# Patient Record
Sex: Female | Born: 2000 | Race: Black or African American | Hispanic: No | Marital: Single | Smoking: Never smoker
Health system: Southern US, Community
[De-identification: ages and names within clinical notes are randomized; demographics above are authoritative.]

---
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
## 2020-03-03 IMAGING — US US OB LIMITED
1 series · 14 of 28 positions shown · non-contrast
Comparison: none

CLINICAL DATA: Abdominal pain and vomiting for 1 day.  Unknown LMP.

EXAM:
LIMITED OBSTETRIC ULTRASOUND

[Series 1: us ob limited · 0.22mm/px · 14 of 45 slices shown]
[im 2/45]
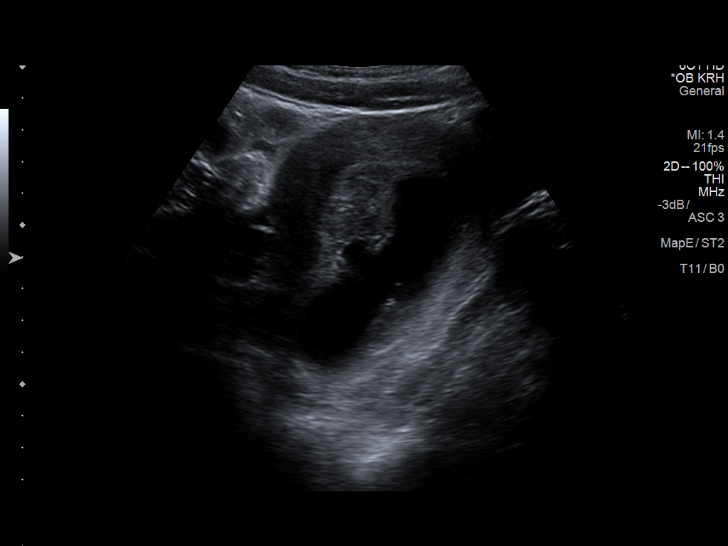
[im 5/45]
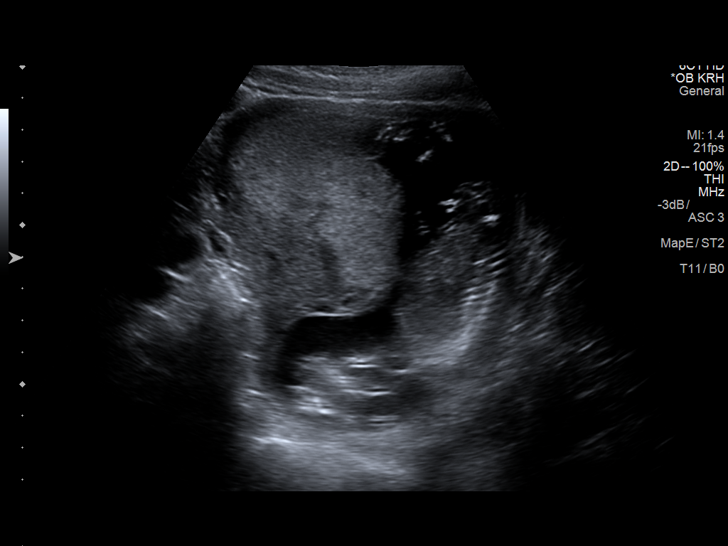
[im 9/45]
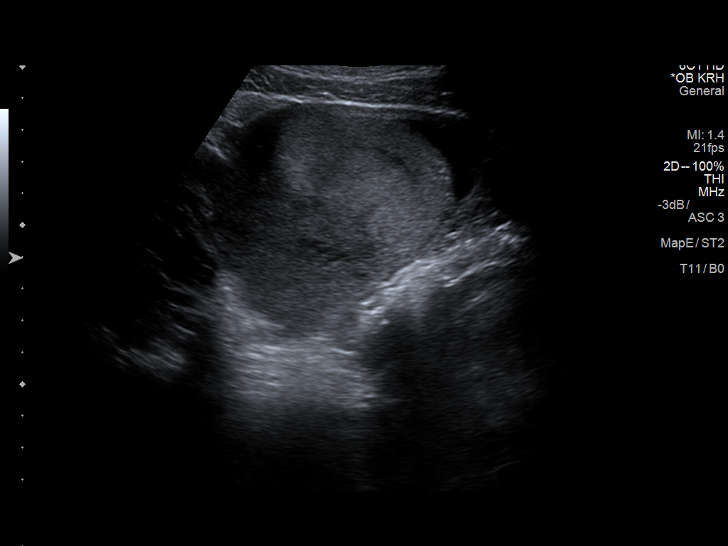
[im 12/45]
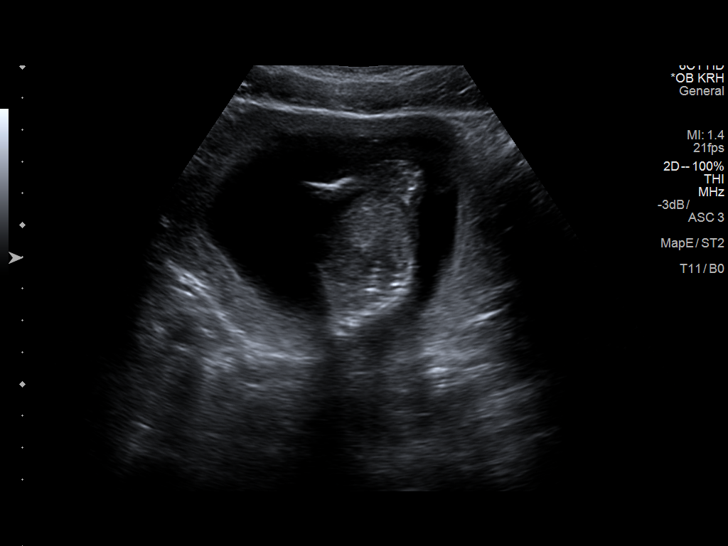
[im 15/45]
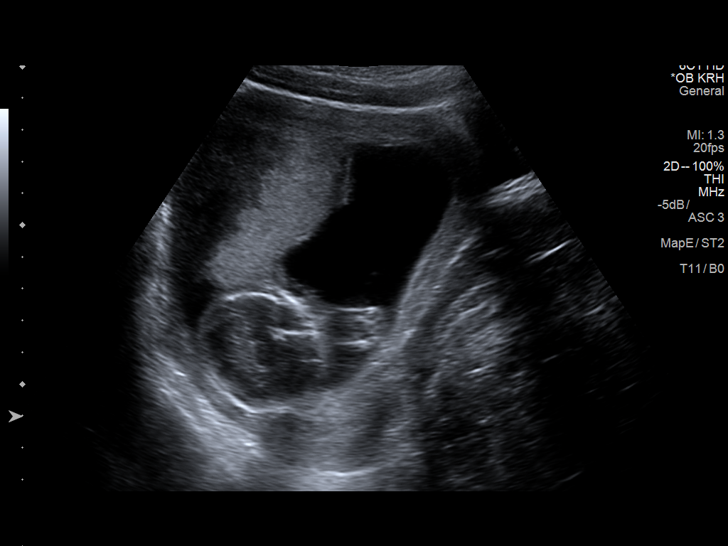
[im 18/45]
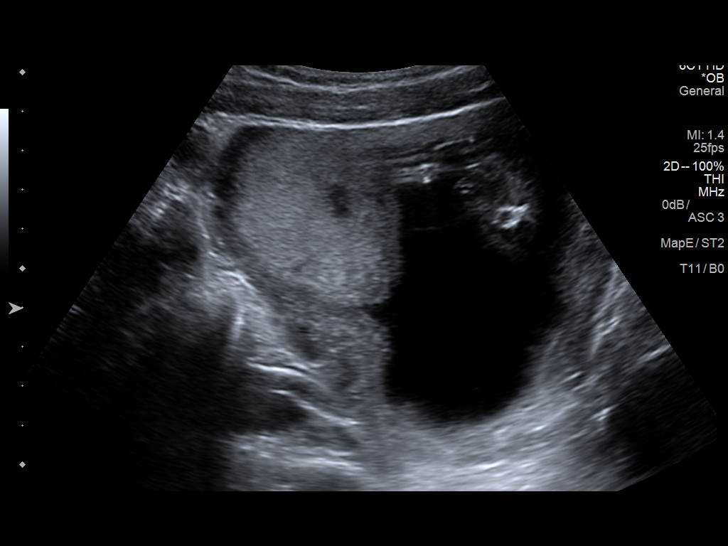
[im 22/45]
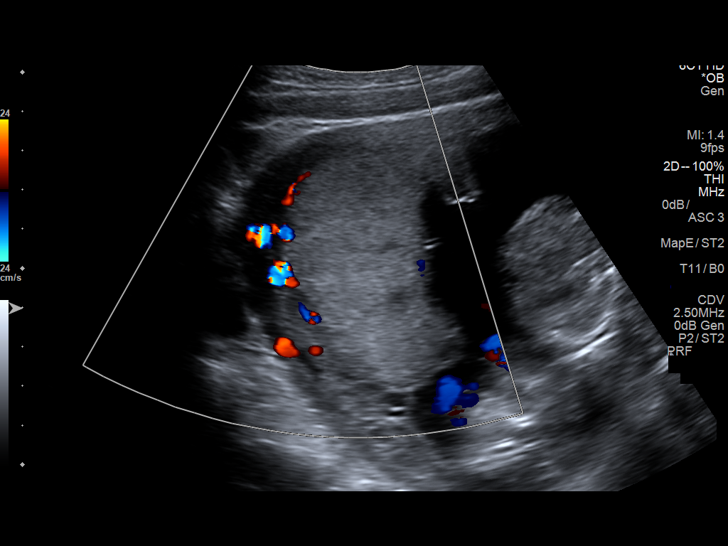
[im 25/45]
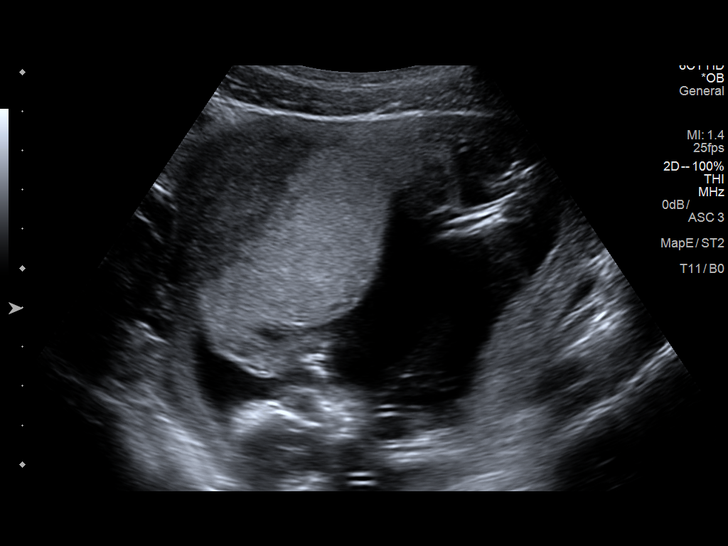
[im 28/45]
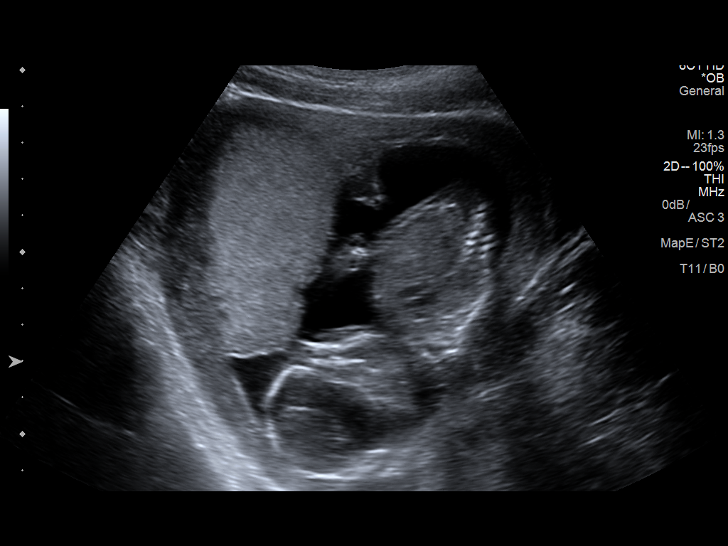
[im 31/45]
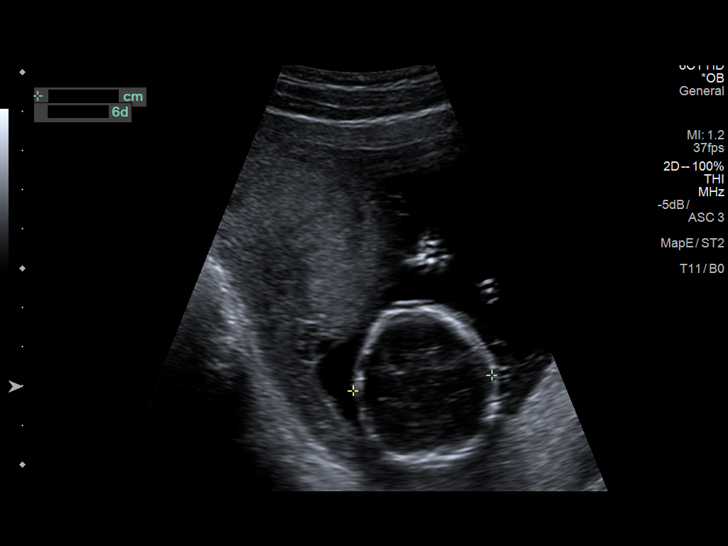
[im 35/45]
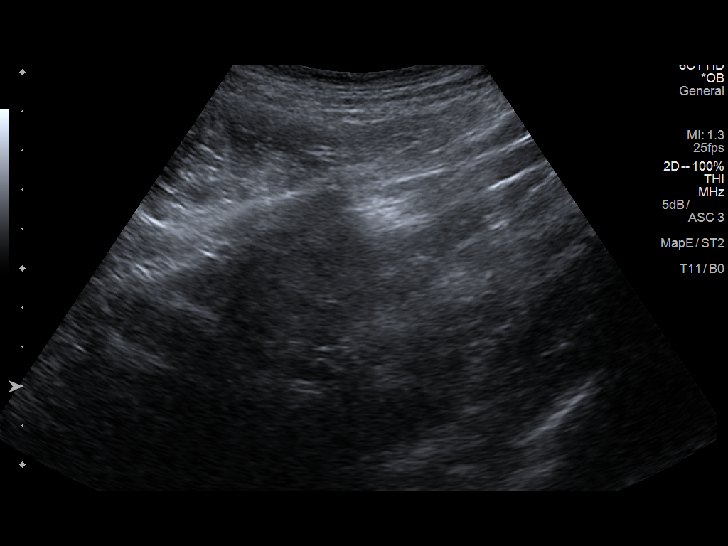
[im 38/45]
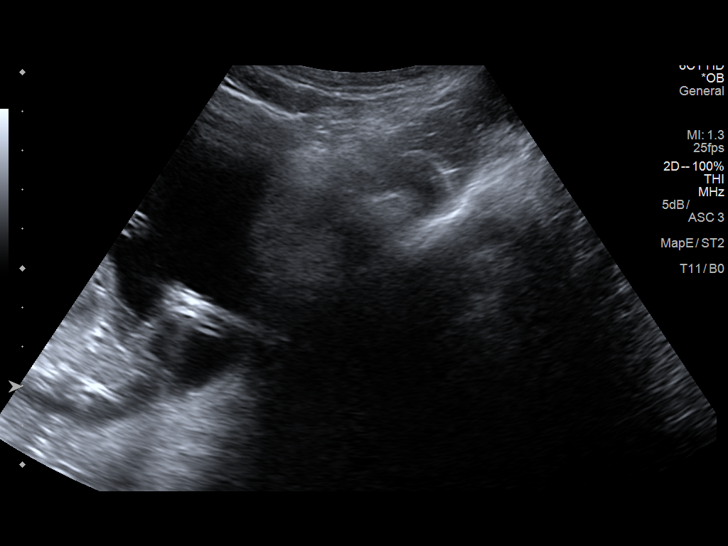
[im 41/45]
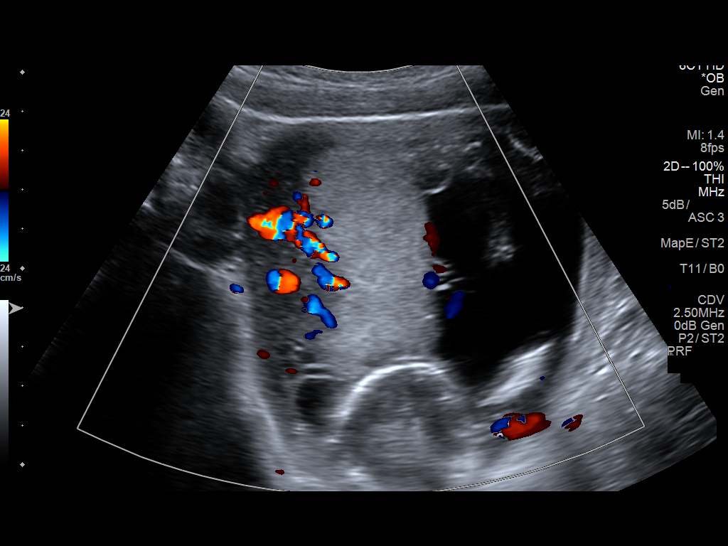
[im 45/45]
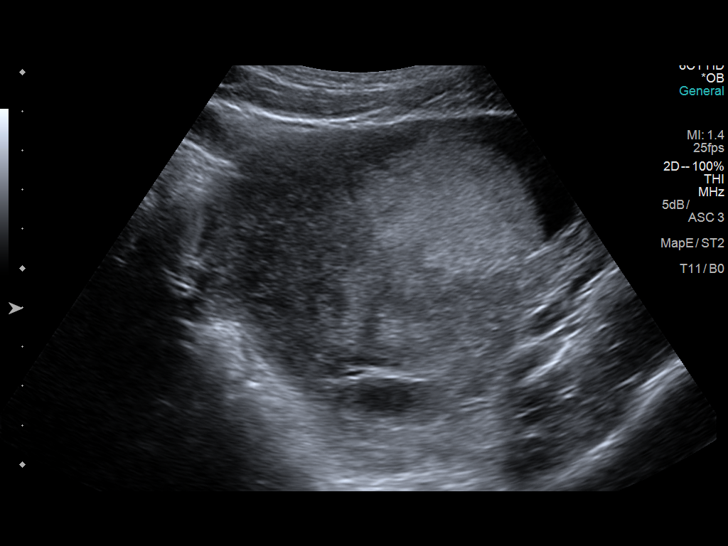

[14 of 28 positions shown; findings below may reference images not displayed]

FINDINGS: Number of Fetuses: 1

Heart Rate:  157 bpm

Movement: Fetal movement is observed.

Presentation: Breech

Placental Location: Lateral and posterior to the right.

Previa: None.

Amniotic Fluid (Subjective):  Within normal limits.

AFI: Not measured

BPD: 3.55 cm 16 w  6 d

MATERNAL FINDINGS:

Cervix:  Appears closed.

Uterus/Adnexae: Limited visualization. No myometrial mass lesions
identified. Color flow Doppler images demonstrate some increased
flow posterior to the placenta of nonspecific etiology. No
retroplacental fluid collections.
IMPRESSION: Single intrauterine pregnancy with fetus in breech presentation. No
placenta previa. No acute complication is demonstrated on limited
imaging. A complete obstetrical ultrasound is recommended for better
evaluation of fetal anatomy and more accurate dating.

This exam is performed on an emergent basis and does not
comprehensively evaluate fetal size, dating, or anatomy; follow-up
complete OB US should be considered if further fetal assessment is
warranted.
